# Patient Record
Sex: Female | Born: 2000 | Race: White | Hispanic: No | Marital: Single
Health system: Southern US, Community
[De-identification: ages and names within clinical notes are randomized; demographics above are authoritative.]

---
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
## 2021-03-24 ENCOUNTER — Other Ambulatory Visit: Payer: Self-pay

## 2021-03-24 ENCOUNTER — Ambulatory Visit (HOSPITAL_COMMUNITY)
Admission: EM | Admit: 2021-03-24 | Discharge: 2021-03-24 | Disposition: A | Discharge status: Home / Self Care | Payer: Medicaid Other | Attending: Family Medicine | Admitting: Family Medicine

## 2021-03-24 ENCOUNTER — Encounter (HOSPITAL_COMMUNITY): Payer: Self-pay

## 2021-03-24 DIAGNOSIS — H66002 Acute suppurative otitis media without spontaneous rupture of ear drum, left ear: Secondary | ICD-10-CM | POA: Diagnosis not present

## 2021-03-24 MED ORDER — FLUTICASONE PROPIONATE 50 MCG/ACT NA SUSP: 1.0000 | Freq: Two times a day (BID) | NASAL | 0 refills | Status: AC

## 2021-03-24 MED ORDER — AMOXICILLIN 875 MG PO TABS: 875.0000 mg | ORAL_TABLET | Freq: Two times a day (BID) | ORAL | 0 refills | Status: AC

## 2021-03-24 MED ORDER — PSEUDOEPHEDRINE HCL 30 MG PO TABS: 30.0000 mg | ORAL_TABLET | ORAL | 0 refills | Status: AC | PRN

## 2021-03-24 NOTE — ED Triage Notes (Signed)
Pt reports having sxs of sinus infection. States she has had ear pain and has felt her ear is clogged.

## 2021-03-24 NOTE — ED Provider Notes (Signed)
[facility]    CSN: [id number] Arrival date & time: 03/24/21  6063      History   Chief Complaint Chief Complaint  Patient presents with   Ear Pain   sinus pressure    HPI Sheena Simmons is a 20 y.o. female.   Presenting today with over a week of flulike symptoms overall seem to be improving though still has congestion, cough, sinus pressure and comes in today for worsening ear pain, particularly on the left side and now muffled hearing.  Denies fever, drainage from the ear, headache or abdominal pain, nausea vomiting or diarrhea.  Taking DayQuil and NyQuil with minimal relief.  No known history of pertinent medical problems.   History reviewed. No pertinent past medical history.  There are no problems to display for this patient.   History reviewed. No pertinent surgical history.  OB History   No obstetric history on file.      Home Medications    Prior to Admission medications   Medication Sig Start Date End Date Taking? Authorizing Provider  amoxicillin (AMOXIL) 875 MG tablet Take 1 tablet (875 mg total) by mouth 2 (two) times daily. 03/24/21  Yes Particia Nearing, PA-C  fluticasone [facility]) 50 MCG/ACT nasal spray Place 1 spray into both nostrils 2 (two) times daily. 03/24/21  Yes Particia Nearing, PA-C  pseudoephedrine (SUDAFED) 30 MG tablet Take 1 tablet (30 mg total) by mouth every 4 (four) hours as needed for congestion. 03/24/21  Yes Particia Nearing, PA-C  ibuprofen (ADVIL,MOTRIN) 400 MG tablet Take 1 tablet (400 mg total) by mouth every 6 (six) hours as needed. 03/13/16   Lowanda Foster, NP    Family History History reviewed. No pertinent family history.  Social History Social History   Tobacco Use   Smoking status: Passive Smoke Exposure - Never Smoker   Smokeless tobacco: Never     Allergies   Patient has no known allergies.   Review of Systems Review of Systems Per HPI  Physical Exam Triage Vital Signs ED  Triage Vitals  Enc Vitals Group     BP 03/24/21 1041 106/65     Pulse Rate 03/24/21 1040 71     Resp 03/24/21 1040 17     Temp 03/24/21 1040 98.3 F (36.8 C)     Temp Source 03/24/21 1040 Oral     SpO2 03/24/21 1040 98 %     Weight --      Height --      Head Circumference --      Peak Flow --      Pain Score 03/24/21 1039 6     Pain Loc --      Pain Edu? --      Excl. in GC? --    No data found.  Updated Vital Signs BP 106/65   Pulse 71   Temp 98.3 F (36.8 C) (Oral)   Resp 17   LMP 03/23/2021 (Exact Date)   SpO2 98%   Visual Acuity Right Eye Distance:   Left Eye Distance:   Bilateral Distance:    Right Eye Near:   Left Eye Near:    Bilateral Near:     Physical Exam Vitals and nursing note reviewed.  Constitutional:      Appearance: Normal appearance.  HENT:     Head: Atraumatic.     Right Ear: Tympanic membrane and external ear normal.     Left Ear: External ear normal.  Ears:     Comments: Left TM erythematous, edematous    Nose: Rhinorrhea present.     Mouth/Throat:     Mouth: Mucous membranes are moist.     Pharynx: Posterior oropharyngeal erythema present.  Eyes:     Extraocular Movements: Extraocular movements intact.     Conjunctiva/sclera: Conjunctivae normal.  Cardiovascular:     Rate and Rhythm: Normal rate and regular rhythm.     Heart sounds: Normal heart sounds.  Pulmonary:     Effort: Pulmonary effort is normal.     Breath sounds: Normal breath sounds. No wheezing.  Musculoskeletal:        General: Normal range of motion.     Cervical back: Normal range of motion and neck supple.  Skin:    General: Skin is warm and dry.  Neurological:     Mental Status: She is alert and oriented to person, place, and time.  Psychiatric:        Mood and Affect: Mood normal.        Thought Content: Thought content normal.     UC Treatments / Results  Labs (all labs ordered are listed, but only abnormal results are displayed) Labs Reviewed -  No data to display  EKG   Radiology No results found.  Procedures Procedures (including critical care time)  Medications Ordered in UC Medications - No data to display  Initial Impression / Assessment and Plan / UC Course  I have reviewed the triage vital signs and the nursing notes.  Pertinent labs & imaging results that were available during my care of the patient were reviewed by me and considered in my medical decision making (see chart for details).     Suspect initially viral upper respiratory infection, now with secondary left ear infection.  We will treat with amoxicillin for this, Flonase, Sudafed for eustachian tube dysfunction.  Final Clinical Impressions(s) / UC Diagnoses   Final diagnoses:  Acute suppurative otitis media of left ear without spontaneous rupture of tympanic membrane, recurrence not specified   Discharge Instructions   None    ED Prescriptions     Medication Sig Dispense Auth. Provider   amoxicillin (AMOXIL) 875 MG tablet Take 1 tablet (875 mg total) by mouth 2 (two) times daily. 20 tablet Particia Nearing, PA-C   fluticasone [facility]) 50 MCG/ACT nasal spray Place 1 spray into both nostrils 2 (two) times daily. 16 g Particia Nearing, [place]   pseudoephedrine (SUDAFED) 30 MG tablet Take 1 tablet (30 mg total) by mouth every 4 (four) hours as needed for congestion. 20 tablet Particia Nearing, [place]      PDMP not reviewed this encounter.   Particia Nearing, [place] 03/24/21 1158

## 2021-04-15 ENCOUNTER — Other Ambulatory Visit: Payer: Self-pay | Admitting: Family Medicine
